# Patient Record
Sex: Male | Born: 2000 | Race: Black or African American | Hispanic: No | Marital: Single | Smoking: Never smoker
Health system: Southern US, Community
[De-identification: ages and names within clinical notes are randomized; demographics above are authoritative.]

---
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
## 2018-02-06 DIAGNOSIS — R55 Syncope and collapse: Secondary | ICD-10-CM | POA: Diagnosis not present

## 2018-02-06 DIAGNOSIS — R531 Weakness: Secondary | ICD-10-CM | POA: Diagnosis not present

## 2018-04-01 ENCOUNTER — Emergency Department (HOSPITAL_COMMUNITY)
Admission: EM | Admit: 2018-04-01 | Discharge: 2018-04-01 | Disposition: A | Discharge status: Home / Self Care | Payer: Medicaid Other | Attending: Emergency Medicine | Admitting: Emergency Medicine

## 2018-04-01 ENCOUNTER — Encounter (HOSPITAL_COMMUNITY): Payer: Self-pay | Admitting: Emergency Medicine

## 2018-04-01 DIAGNOSIS — J45909 Unspecified asthma, uncomplicated: Secondary | ICD-10-CM | POA: Diagnosis not present

## 2018-04-01 DIAGNOSIS — R0981 Nasal congestion: Secondary | ICD-10-CM | POA: Insufficient documentation

## 2018-04-01 MED ORDER — ALBUTEROL SULFATE HFA 108 (90 BASE) MCG/ACT IN AERS: 1.0000 | INHALATION_SPRAY | Freq: Four times a day (QID) | RESPIRATORY_TRACT | 0 refills | Status: AC | PRN

## 2018-04-01 NOTE — ED Notes (Signed)
Pt is complaining of sinus pressure as well as ear aches. States started a few days ago and has been "coming and going" Has tried [employer] and [employer]

## 2018-04-01 NOTE — ED Provider Notes (Signed)
[facility] EMERGENCY DEPARTMENT Provider Note   CSN: [id number] Arrival date & time: 04/01/18  1755     History   Chief Complaint Chief Complaint  Patient presents with  . Nasal Congestion    HPI Brandon Hull is a 17 y.o. male.   URI   This is a new problem. The current episode started 12 to 24 hours ago. The problem has not changed since onset.There has been no fever. Associated symptoms include congestion, headaches (mild frontal sinus), plugged ear sensation, rhinorrhea, sinus pain and wheezing (yesterday, improved after inhaler). Pertinent negatives include no chest pain, no nausea, no vomiting, no ear pain, no sneezing, no sore throat, no swollen glands and no cough.    Past Medical History:  Diagnosis Date  . Asthma     Patient Active Problem List   Diagnosis Date Noted  . Fracture, posterior malleolus 12/18/2013    History reviewed. No pertinent surgical history.      Home Medications    Prior to Admission medications   Medication Sig Start Date End Date Taking? Authorizing Provider  Acetaminophen (TYLENOL PO) Take 1 tablet by mouth once.    [provider]  albuterol (PROVENTIL HFA;VENTOLIN HFA) 108 (90 Base) MCG/ACT inhaler Inhale 1-2 puffs into the lungs every 6 (six) hours as needed for wheezing. 04/01/18   Acacia Latorre, Barbara Cower, MD    Family History Family History  Problem Relation Age of Onset  . Diabetes Other   . Hypertension Other     Social History Social History   Tobacco Use  . Smoking status: Never Smoker  . Smokeless tobacco: Never Used  Substance Use Topics  . Alcohol use: No  . Drug use: Yes    Types: Marijuana    Comment: daily     Allergies   Peanuts [peanut oil]   Review of Systems Review of Systems  HENT: Positive for congestion, rhinorrhea and sinus pain. Negative for ear pain, sneezing and sore throat.   Respiratory: Positive for wheezing (yesterday, improved after inhaler). Negative for cough.     Cardiovascular: Negative for chest pain.  Gastrointestinal: Negative for nausea and vomiting.  Neurological: Positive for headaches (mild frontal sinus).  All other systems reviewed and are negative.    Physical Exam Updated Vital Signs BP 127/67 (BP Location: Right Arm)   Pulse 85   Temp 98.8 F (37.1 C) (Oral)   Resp 16   Ht 6\' 3"  (1.905 m)   Wt 59 kg   SpO2 99%   BMI 16.25 kg/m   Physical Exam  Constitutional: He appears well-developed and well-nourished.  HENT:  Head: Normocephalic and atraumatic.  Nose: Mucosal edema and rhinorrhea present.  Mouth/Throat: Oropharynx is clear and moist.  Eyes: Conjunctivae and EOM are normal.  Neck: Normal range of motion.  Cardiovascular: Normal rate.  Pulmonary/Chest: Effort normal. No respiratory distress.  Abdominal: He exhibits no distension.  Musculoskeletal: Normal range of motion.  Neurological: He is alert.  Nursing note and vitals reviewed.    ED Treatments / Results  Labs (all labs ordered are listed, but only abnormal results are displayed) Labs Reviewed - No data to display  EKG None  Radiology No results found.  Procedures Procedures (including critical care time)  Medications Ordered in ED Medications - No data to display   Initial Impression / Assessment and Plan / ED Course  I have reviewed the triage vital signs and the nursing notes.  Pertinent labs & imaging results that were available during my  care of the patient were reviewed by me and considered in my medical decision making (see chart for details).     URI without e/o complication or other associated symptoms. Patient states inhaler ran out yesterday. No e/o acute asthma exacerbation, will rx same, no indication for steroids currently.   Final Clinical Impressions(s) / ED Diagnoses   Final diagnoses:  Nasal congestion    ED Discharge Orders         Ordered    albuterol (PROVENTIL HFA;VENTOLIN HFA) 108 (90 Base) MCG/ACT inhaler   Every 6 hours PRN     04/01/18 1905           Shayan Bramhall, Barbara Cower, MD 04/01/18 2119

## 2018-04-01 NOTE — ED Triage Notes (Signed)
Pt reports nasal congestion for a week with pressure in head.

## 2018-04-01 NOTE — Discharge Instructions (Addendum)
You can try flonase or neti pots (or any type of nasal saline rinse) for your symptoms.  You can also add on a form of sudafed if you like this may give more help but don't take at night.  Follow up with your doctor if not improving in 7-10 days.

## 2019-05-13 ENCOUNTER — Emergency Department (HOSPITAL_COMMUNITY)
Admission: EM | Admit: 2019-05-13 | Discharge: 2019-05-13 | Disposition: A | Discharge status: Home / Self Care | Payer: Medicaid Other | Attending: Emergency Medicine | Admitting: Emergency Medicine

## 2019-05-13 ENCOUNTER — Other Ambulatory Visit: Payer: Self-pay

## 2019-05-13 ENCOUNTER — Encounter (HOSPITAL_COMMUNITY): Payer: Self-pay | Admitting: Emergency Medicine

## 2019-05-13 DIAGNOSIS — H9201 Otalgia, right ear: Secondary | ICD-10-CM | POA: Diagnosis present

## 2019-05-13 DIAGNOSIS — Z79899 Other long term (current) drug therapy: Secondary | ICD-10-CM | POA: Insufficient documentation

## 2019-05-13 DIAGNOSIS — Z91010 Allergy to peanuts: Secondary | ICD-10-CM | POA: Diagnosis not present

## 2019-05-13 DIAGNOSIS — H6123 Impacted cerumen, bilateral: Secondary | ICD-10-CM | POA: Diagnosis not present

## 2019-05-13 DIAGNOSIS — J45909 Unspecified asthma, uncomplicated: Secondary | ICD-10-CM | POA: Insufficient documentation

## 2019-05-13 MED ORDER — CARBAMIDE PEROXIDE 6.5 % OT SOLN: 5.0000 [drp] | Freq: Two times a day (BID) | OTIC | 0 refills | Status: AC

## 2019-05-13 NOTE — ED Provider Notes (Signed)
[facility] EMERGENCY DEPARTMENT Provider Note   CSN: [id number] Arrival date & time: 05/13/19  1726     History   Chief Complaint Chief Complaint  Patient presents with  . Otalgia    HPI Brandon Hull is a 18 y.o. male.     HPI   18 year old male with history of asthma presenting for evaluation of right ear pain.  Patient states that last week he developed a bump to his right auricle it was painful.  States that he popped this and a small amount of pus came out.  Pain is improved since onset.  States he did not have any pain from this for the last 2 days however today while he was playing videogames he had a sharp pain in the right ear that lasted for about a minute.  Because of this he decided to come to the ED. Denies fevers or other associated sxs.   Past Medical History:  Diagnosis Date  . Asthma     Patient Active Problem List   Diagnosis Date Noted  . Fracture, posterior malleolus 12/18/2013    History reviewed. No pertinent surgical history.      Home Medications    Prior to Admission medications   Medication Sig Start Date End Date Taking? Authorizing Provider  Acetaminophen (TYLENOL PO) Take 1 tablet by mouth once.    [provider]  albuterol (PROVENTIL HFA;VENTOLIN HFA) 108 (90 Base) MCG/ACT inhaler Inhale 1-2 puffs into the lungs every 6 (six) hours as needed for wheezing. 04/01/18   Mesner, Corene Cornea, MD  carbamide peroxide (DEBROX) 6.5 % OTIC solution Place 5 drops into both ears 2 (two) times daily for 4 days. 05/13/19 05/17/19  Jackqueline Aquilar S, PA-C    Family History Family History  Problem Relation Age of Onset  . Diabetes Other   . Hypertension Other     Social History Social History   Tobacco Use  . Smoking status: Never Smoker  . Smokeless tobacco: Never Used  Substance Use Topics  . Alcohol use: No  . Drug use: Yes    Types: Marijuana    Comment: daily     Allergies   Peanuts [peanut oil]   Review of Systems  Review of Systems  Constitutional: Negative for fever.  HENT: Positive for ear pain (resolved). Negative for congestion, rhinorrhea and sore throat.   Respiratory: Negative for cough.      Physical Exam Updated Vital Signs BP (!) 141/85 (BP Location: Right Arm)   Pulse (!) 102   Temp 99 F (37.2 C) (Oral)   Resp 18   Ht 6\' 3"  (1.905 m)   Wt 59.9 kg   SpO2 99%   BMI 16.50 kg/m   Physical Exam Constitutional:      General: He is not in acute distress.    Appearance: He is well-developed.  HENT:     Right Ear: External ear normal. There is impacted cerumen.     Left Ear: External ear normal. There is impacted cerumen.  Eyes:     Conjunctiva/sclera: Conjunctivae normal.  Cardiovascular:     Rate and Rhythm: Normal rate and regular rhythm.  Pulmonary:     Effort: Pulmonary effort is normal.     Breath sounds: Normal breath sounds.  Skin:    General: Skin is warm and dry.  Neurological:     Mental Status: He is alert and oriented to person, place, and time.      ED Treatments / Results  Labs (  all labs ordered are listed, but only abnormal results are displayed) Labs Reviewed - No data to display  EKG None  Radiology No results found.  Procedures Procedures (including critical care time)  Medications Ordered in ED Medications - No data to display   Initial Impression / Assessment and Plan / ED Course  I have reviewed the triage vital signs and the nursing notes.  Pertinent labs & imaging results that were available during my care of the patient were reviewed by me and considered in my medical decision making (see chart for details).     Final Clinical Impressions(s) / ED Diagnoses   Final diagnoses:  Bilateral impacted cerumen   18 year old male presents for evaluation of right ear pain that has since resolved.  A week ago he had a pain to his right ear and had what he described as a pimple which he popped.  Symptoms then resolved however he developed  a sharp temporary pain today which brought him to the emergency room.  On exam, external auditory canals are normal bilaterally but he does have impacted cerumen bilaterally.  He does not have any redness or skin changes to suggest infection.  I am unable to visualize his TMs due to cerumen impaction. I have low suspicion for OM or OE. I will tx him with debrox for cerumen impaction. Advised to f/u and return if worse.   ED Discharge Orders         Ordered    carbamide peroxide (DEBROX) 6.5 % OTIC solution  2 times daily     05/13/19 1838           Karrie Meres, PA-C 05/13/19 1839    Mancel Bale, MD 05/13/19 2321

## 2019-05-13 NOTE — ED Triage Notes (Signed)
PT c/o right ear pain x2 days. PT states he had a bump inside the right ear and it opened up and drained.

## 2019-05-13 NOTE — Discharge Instructions (Addendum)
Use ear drops as directed.   Please follow up with your primary care provider within 5-7 days for re-evaluation of your symptoms. If you do not have a primary care provider, information for a healthcare clinic has been provided for you to make arrangements for follow up care. Please return to the emergency department for any new or worsening symptoms.

## 2019-05-15 ENCOUNTER — Other Ambulatory Visit: Payer: Self-pay

## 2019-05-15 DIAGNOSIS — Z20828 Contact with and (suspected) exposure to other viral communicable diseases: Secondary | ICD-10-CM | POA: Diagnosis not present

## 2019-05-15 DIAGNOSIS — Z20822 Contact with and (suspected) exposure to covid-19: Secondary | ICD-10-CM

## 2019-05-17 LAB — NOVEL CORONAVIRUS, NAA: SARS-CoV-2, NAA: NOT DETECTED

## 2019-05-23 ENCOUNTER — Emergency Department (HOSPITAL_COMMUNITY)
Admission: EM | Admit: 2019-05-23 | Discharge: 2019-05-23 | Disposition: A | Discharge status: Home / Self Care | Payer: Medicaid Other | Attending: Emergency Medicine | Admitting: Emergency Medicine

## 2019-05-23 ENCOUNTER — Other Ambulatory Visit: Payer: Self-pay

## 2019-05-23 ENCOUNTER — Encounter (HOSPITAL_COMMUNITY): Payer: Self-pay | Admitting: *Deleted

## 2019-05-23 DIAGNOSIS — J45909 Unspecified asthma, uncomplicated: Secondary | ICD-10-CM | POA: Diagnosis not present

## 2019-05-23 DIAGNOSIS — K0889 Other specified disorders of teeth and supporting structures: Secondary | ICD-10-CM | POA: Diagnosis not present

## 2019-05-23 DIAGNOSIS — K029 Dental caries, unspecified: Secondary | ICD-10-CM | POA: Diagnosis not present

## 2019-05-23 DIAGNOSIS — F1210 Cannabis abuse, uncomplicated: Secondary | ICD-10-CM | POA: Diagnosis not present

## 2019-05-23 MED ORDER — PENICILLIN V POTASSIUM 500 MG PO TABS: 500.0000 mg | ORAL_TABLET | Freq: Four times a day (QID) | ORAL | 0 refills | Status: AC

## 2019-05-23 NOTE — ED Provider Notes (Signed)
[facility] EMERGENCY DEPARTMENT Provider Note   CSN: [id number] Arrival date & time: 05/23/19  1530     History Chief Complaint  Patient presents with  . Dental Pain    Brandon Hull is a 18 y.o. male.  HPI   Patient is a 18 year old male with a history of asthma who presents the emergency department today complaining of left upper dental pain that started while he was at work earlier today.  States he took some over-the-counter medication which ultimately did improve his symptoms and now his pain is improved but he did have to leave work and he needs a work note now.  He has had no fevers.  He does have a dentist but he has not tried following up with a dentist.  Denies any facial swelling or other symptoms.  Past Medical History:  Diagnosis Date  . Asthma     Patient Active Problem List   Diagnosis Date Noted  . Fracture, posterior malleolus 12/18/2013    History reviewed. No pertinent surgical history.     Family History  Problem Relation Age of Onset  . Diabetes Other   . Hypertension Other     Social History   Tobacco Use  . Smoking status: Never Smoker  . Smokeless tobacco: Never Used  Substance Use Topics  . Alcohol use: No  . Drug use: Yes    Types: Marijuana    Comment: daily    Home Medications Prior to Admission medications   Medication Sig Start Date End Date Taking? Authorizing Provider  Acetaminophen (TYLENOL PO) Take 1 tablet by mouth once.    [provider]  albuterol (PROVENTIL HFA;VENTOLIN HFA) 108 (90 Base) MCG/ACT inhaler Inhale 1-2 puffs into the lungs every 6 (six) hours as needed for wheezing. 04/01/18   Mesner, Corene Cornea, MD  penicillin v potassium (VEETID) 500 MG tablet Take 1 tablet (500 mg total) by mouth 4 (four) times daily for 7 days. 05/23/19 05/30/19  Genessa Beman S, PA-C    Allergies    Peanuts [peanut oil]  Review of Systems   Review of Systems  Constitutional: Negative for fever.  HENT: Positive for  dental problem. Negative for sore throat.     Physical Exam Updated Vital Signs BP (!) 130/91 (BP Location: Right Arm)   Pulse 98   Temp 98.2 F (36.8 C) (Oral)   Resp 18   Ht 6\' 3"  (1.905 m)   Wt 61.2 kg   SpO2 100%   BMI 16.87 kg/m   Physical Exam Constitutional:      General: He is not in acute distress.    Appearance: He is well-developed.  HENT:     Mouth/Throat:     Comments: Tooth number 14 has dental caries and is TTP. No periapical abscess present. No trismus, sublingual or submandibular swelling.  Eyes:     Conjunctiva/sclera: Conjunctivae normal.  Cardiovascular:     Rate and Rhythm: Normal rate and regular rhythm.  Pulmonary:     Effort: Pulmonary effort is normal.     Breath sounds: Normal breath sounds.  Lymphadenopathy:     Cervical: No cervical adenopathy.  Skin:    General: Skin is warm and dry.  Neurological:     Mental Status: He is alert and oriented to person, place, and time.     ED Results / Procedures / Treatments   Labs (all labs ordered are listed, but only abnormal results are displayed) Labs Reviewed - No data to display  EKG  None  Radiology No results found.  Procedures Procedures (including critical care time)  Medications Ordered in ED Medications - No data to display  ED Course  I have reviewed the triage vital signs and the nursing notes.  Pertinent labs & imaging results that were available during my care of the patient were reviewed by me and considered in my medical decision making (see chart for details).    MDM Rules/Calculators/A&P       Patient with toothache.  No gross abscess.  Exam unconcerning for Ludwig's angina or spread of infection.  Will treat with penicillin and pain medicine.  Urged patient to follow-up with dentist.     Final Clinical Impression(s) / ED Diagnoses Final diagnoses:  Pain, dental  Dental caries    Rx / DC Orders ED Discharge Orders         Ordered    penicillin v potassium  (VEETID) 500 MG tablet  4 times daily     05/23/19 [address] [place], PA-C 05/23/19 1606    Gerhard Munch, MD 05/26/19 [phone]

## 2019-05-23 NOTE — Discharge Instructions (Addendum)
You were given a prescription for antibiotics. Please take the antibiotic prescription fully.   Please follow-up with a dentist in the next 5 to 7 days for reevaluation.  If you do not have a dentist, resources were provided for dentist in the area in your discharge summary.  Please contact one of the offices that are listed and make an appointment for follow-up.  Please return to the emergency department for any new or worsening symptoms.  

## 2019-05-23 NOTE — ED Triage Notes (Signed)
Pt c/o left upper tooth pain; pt states he has a tooth with a hole in it and he woke up in pain this am with the pain getting progressively worse throughout the day

## 2020-01-22 ENCOUNTER — Other Ambulatory Visit: Payer: Self-pay

## 2020-01-22 ENCOUNTER — Other Ambulatory Visit: Payer: Medicaid Other

## 2020-01-22 DIAGNOSIS — Z20822 Contact with and (suspected) exposure to covid-19: Secondary | ICD-10-CM | POA: Diagnosis not present

## 2020-01-23 LAB — NOVEL CORONAVIRUS, NAA: SARS-CoV-2, NAA: NOT DETECTED

## 2020-01-23 LAB — SARS-COV-2, NAA 2 DAY TAT

## 2020-02-11 ENCOUNTER — Other Ambulatory Visit: Payer: Self-pay

## 2020-02-11 ENCOUNTER — Emergency Department (HOSPITAL_COMMUNITY)
Admission: EM | Admit: 2020-02-11 | Discharge: 2020-02-11 | Disposition: A | Discharge status: Home / Self Care | Payer: Medicaid Other | Attending: Emergency Medicine | Admitting: Emergency Medicine

## 2020-02-11 ENCOUNTER — Encounter (HOSPITAL_COMMUNITY): Payer: Self-pay

## 2020-02-11 DIAGNOSIS — M545 Low back pain: Secondary | ICD-10-CM | POA: Diagnosis not present

## 2020-02-11 DIAGNOSIS — Z5321 Procedure and treatment not carried out due to patient leaving prior to being seen by health care provider: Secondary | ICD-10-CM | POA: Diagnosis not present

## 2020-02-11 NOTE — ED Triage Notes (Signed)
Pt presents to ED with complaints of lower back pain x a couple weeks. Pt denies urinary symptoms.

## 2020-02-11 NOTE — ED Notes (Signed)
Left prior to triage 

## 2020-02-22 DIAGNOSIS — Z20822 Contact with and (suspected) exposure to covid-19: Secondary | ICD-10-CM | POA: Diagnosis not present

## 2021-10-07 ENCOUNTER — Other Ambulatory Visit: Payer: Self-pay

## 2021-10-07 ENCOUNTER — Encounter (HOSPITAL_COMMUNITY): Payer: Self-pay

## 2021-10-07 ENCOUNTER — Emergency Department (HOSPITAL_COMMUNITY)
Admission: EM | Admit: 2021-10-07 | Discharge: 2021-10-08 | Disposition: A | Discharge status: Home / Self Care | Payer: Medicaid Other | Attending: Emergency Medicine | Admitting: Emergency Medicine

## 2021-10-07 DIAGNOSIS — M542 Cervicalgia: Secondary | ICD-10-CM | POA: Insufficient documentation

## 2021-10-07 DIAGNOSIS — Z91010 Allergy to peanuts: Secondary | ICD-10-CM | POA: Insufficient documentation

## 2021-10-07 DIAGNOSIS — Y92410 Unspecified street and highway as the place of occurrence of the external cause: Secondary | ICD-10-CM | POA: Insufficient documentation

## 2021-10-07 DIAGNOSIS — S39012A Strain of muscle, fascia and tendon of lower back, initial encounter: Secondary | ICD-10-CM | POA: Insufficient documentation

## 2021-10-07 DIAGNOSIS — S3992XA Unspecified injury of lower back, initial encounter: Secondary | ICD-10-CM | POA: Diagnosis present

## 2021-10-07 MED ORDER — IBUPROFEN 800 MG PO TABS: 800.0000 mg | ORAL_TABLET | Freq: Four times a day (QID) | ORAL | 0 refills | Status: AC | PRN

## 2021-10-07 MED ORDER — IBUPROFEN 800 MG PO TABS
800.0000 mg | ORAL_TABLET | Freq: Once | ORAL | Status: AC
  Administered 2021-10-08: 800 mg via ORAL
  Filled 2021-10-07: qty 1

## 2021-10-07 MED ORDER — OXYCODONE-ACETAMINOPHEN 5-325 MG PO TABS
1.0000 | ORAL_TABLET | Freq: Once | ORAL | Status: AC
  Administered 2021-10-08: 1 via ORAL
  Filled 2021-10-07: qty 1

## 2021-10-07 MED ORDER — METHOCARBAMOL 500 MG PO TABS: 500.0000 mg | ORAL_TABLET | Freq: Three times a day (TID) | ORAL | 0 refills | Status: AC | PRN
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
## 2021-10-10 ENCOUNTER — Other Ambulatory Visit: Payer: Self-pay

## 2021-10-10 ENCOUNTER — Encounter (HOSPITAL_COMMUNITY): Payer: Self-pay | Admitting: *Deleted

## 2021-10-10 ENCOUNTER — Emergency Department (HOSPITAL_COMMUNITY)
Admission: EM | Admit: 2021-10-10 | Discharge: 2021-10-10 | Disposition: A | Discharge status: Home / Self Care | Payer: Medicaid Other | Attending: Emergency Medicine | Admitting: Emergency Medicine

## 2021-10-10 ENCOUNTER — Emergency Department (HOSPITAL_COMMUNITY): Payer: Medicaid Other

## 2021-10-10 DIAGNOSIS — Z91010 Allergy to peanuts: Secondary | ICD-10-CM | POA: Insufficient documentation

## 2021-10-10 DIAGNOSIS — Y92410 Unspecified street and highway as the place of occurrence of the external cause: Secondary | ICD-10-CM | POA: Insufficient documentation

## 2021-10-10 DIAGNOSIS — S39012D Strain of muscle, fascia and tendon of lower back, subsequent encounter: Secondary | ICD-10-CM | POA: Insufficient documentation

## 2021-10-10 DIAGNOSIS — S3992XD Unspecified injury of lower back, subsequent encounter: Secondary | ICD-10-CM | POA: Diagnosis present

## 2021-10-10 DIAGNOSIS — M546 Pain in thoracic spine: Secondary | ICD-10-CM | POA: Insufficient documentation

## 2021-10-10 DIAGNOSIS — M545 Low back pain, unspecified: Secondary | ICD-10-CM | POA: Diagnosis not present

## 2021-10-10 DIAGNOSIS — S39012A Strain of muscle, fascia and tendon of lower back, initial encounter: Secondary | ICD-10-CM | POA: Diagnosis not present

## 2021-10-10 MED ORDER — HYDROCODONE-ACETAMINOPHEN 5-325 MG PO TABS
1.0000 | ORAL_TABLET | Freq: Once | ORAL | Status: AC
  Administered 2021-10-10: 1 via ORAL
  Filled 2021-10-10: qty 1

## 2021-10-10 MED ORDER — NAPROXEN 375 MG PO TABS: 375.0000 mg | ORAL_TABLET | Freq: Two times a day (BID) | ORAL | 0 refills | Status: AC

## 2021-10-10 MED ORDER — METHOCARBAMOL 500 MG PO TABS: 500.0000 mg | ORAL_TABLET | Freq: Every evening | ORAL | 0 refills | Status: AC

## 2021-10-10 NOTE — Discharge Instructions (Addendum)
Your x-ray images were negative for acute fracture in the lower back.  You have been provided the contact information for an orthopedic office to see Dr. Romeo Apple.  Please call and schedule an appointment within the next 2 to 3 days. ? ?A prescription for Robaxin has been continued.  Remember to only take 1 tablet at night before you go to bed.  He may also continue the use of anti-inflammatory such as ibuprofen or naproxen in conjunction with Tylenol. ? ?Return to the ED for new or worsening symptoms as discussed. ?
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
## 2021-10-10 NOTE — ED Notes (Signed)
ED Provider at bedside. 
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
## 2021-10-11 ENCOUNTER — Telehealth: Payer: Self-pay

## 2021-10-11 NOTE — Telephone Encounter (Signed)
Transition Care Management Unsuccessful Follow-up Telephone Call ? ?Date of discharge and from where:  10/10/2021 from [facility] ? ?Attempts:  1st Attempt ? ?Reason for unsuccessful TCM follow-up call:  Unable to leave message ? ? ? ?

## 2021-10-12 NOTE — Telephone Encounter (Signed)
Transition Care Management Unsuccessful Follow-up Telephone Call ? ?Date of discharge and from where:  10/10/2021 from [facility] ? ?Attempts:  2nd Attempt ? ?Reason for unsuccessful TCM follow-up call:  Unable to leave message ? ? ? ?

## 2021-10-13 NOTE — Telephone Encounter (Signed)
Transition Care Management Unsuccessful Follow-up Telephone Call ? ?Date of discharge and from where:  10/10/2021 from [facility] ? ?Attempts:  3rd Attempt ? ?Reason for unsuccessful TCM follow-up call:  Unable to reach patient ? ? ? ?

## 2021-11-09 ENCOUNTER — Emergency Department (HOSPITAL_COMMUNITY)
Admission: EM | Admit: 2021-11-09 | Discharge: 2021-11-09 | Disposition: A | Discharge status: Home / Self Care | Payer: Medicaid Other | Attending: Emergency Medicine | Admitting: Emergency Medicine

## 2021-11-09 ENCOUNTER — Other Ambulatory Visit: Payer: Self-pay

## 2021-11-09 ENCOUNTER — Emergency Department (HOSPITAL_COMMUNITY): Payer: Medicaid Other

## 2021-11-09 ENCOUNTER — Encounter (HOSPITAL_COMMUNITY): Payer: Self-pay | Admitting: Emergency Medicine

## 2021-11-09 DIAGNOSIS — R000 Tachycardia, unspecified: Secondary | ICD-10-CM

## 2021-11-09 DIAGNOSIS — R739 Hyperglycemia, unspecified: Secondary | ICD-10-CM

## 2021-11-09 DIAGNOSIS — Z91010 Allergy to peanuts: Secondary | ICD-10-CM | POA: Diagnosis not present

## 2021-11-09 DIAGNOSIS — F419 Anxiety disorder, unspecified: Secondary | ICD-10-CM | POA: Diagnosis not present

## 2021-11-09 DIAGNOSIS — R002 Palpitations: Secondary | ICD-10-CM | POA: Diagnosis not present

## 2021-11-09 DIAGNOSIS — R079 Chest pain, unspecified: Secondary | ICD-10-CM | POA: Diagnosis not present

## 2021-11-09 LAB — URINALYSIS, ROUTINE W REFLEX MICROSCOPIC
Bilirubin Urine: NEGATIVE
Glucose, UA: NEGATIVE mg/dL
Hgb urine dipstick: NEGATIVE
Ketones, ur: NEGATIVE mg/dL
Leukocytes,Ua: NEGATIVE
Nitrite: NEGATIVE
Protein, ur: NEGATIVE mg/dL
Specific Gravity, Urine: 1.005 (ref 1.005–1.030)
pH: 7 (ref 5.0–8.0)

## 2021-11-09 LAB — BASIC METABOLIC PANEL
Anion gap: 9 (ref 5–15)
BUN: 14 mg/dL (ref 6–20)
CO2: 20 mmol/L — ABNORMAL LOW (ref 22–32)
Calcium: 9.5 mg/dL (ref 8.9–10.3)
Chloride: 107 mmol/L (ref 98–111)
Creatinine, Ser: 1.23 mg/dL (ref 0.61–1.24)
GFR, Estimated: >60 mL/min (ref >60)
Glucose, Bld: 166 mg/dL — ABNORMAL HIGH (ref 70–99)
Potassium: 3 mmol/L — ABNORMAL LOW (ref 3.5–5.1)
Sodium: 136 mmol/L (ref 135–145)

## 2021-11-09 LAB — RAPID URINE DRUG SCREEN, HOSP PERFORMED
Amphetamines: NOT DETECTED
Barbiturates: NOT DETECTED
Benzodiazepines: NOT DETECTED
Cocaine: NOT DETECTED
Opiates: NOT DETECTED
Tetrahydrocannabinol: POSITIVE — AB

## 2021-11-09 LAB — CBC
HCT: 41.8 % (ref 39.0–52.0)
Hemoglobin: 14.9 g/dL (ref 13.0–17.0)
MCH: 31.3 pg (ref 26.0–34.0)
MCHC: 35.6 g/dL (ref 30.0–36.0)
MCV: 87.8 fL (ref 80.0–100.0)
Platelets: 258 10*3/uL (ref 150–400)
RBC: 4.76 MIL/uL (ref 4.22–5.81)
RDW: 12.3 % (ref 11.5–15.5)
WBC: 5.6 10*3/uL (ref 4.0–10.5)
nRBC: 0 % (ref 0.0–0.2)

## 2021-11-09 LAB — TSH: TSH: 0.769 u[IU]/mL (ref 0.350–4.500)

## 2021-11-09 MED ORDER — LORAZEPAM 2 MG/ML IJ SOLN
1.0000 mg | Freq: Once | INTRAMUSCULAR | Status: AC
  Administered 2021-11-09: 1 mg via INTRAVENOUS
  Filled 2021-11-09: qty 1

## 2021-11-09 MED ORDER — SODIUM CHLORIDE 0.9 % IV BOLUS
500.0000 mL | Freq: Once | INTRAVENOUS | Status: AC
  Administered 2021-11-09: 500 mL via INTRAVENOUS

## 2021-11-09 MED ORDER — POTASSIUM CHLORIDE CRYS ER 20 MEQ PO TBCR
40.0000 meq | EXTENDED_RELEASE_TABLET | Freq: Once | ORAL | Status: AC
  Administered 2021-11-09: 40 meq via ORAL
  Filled 2021-11-09: qty 2

## 2021-11-09 MED ORDER — HYDROXYZINE HCL 25 MG PO TABS: 25.0000 mg | ORAL_TABLET | Freq: Four times a day (QID) | ORAL | 0 refills | Status: AC | PRN

## 2021-11-09 NOTE — ED Provider Notes (Signed)
[facility] EMERGENCY DEPARTMENT Provider Note   CSN: [id number] Arrival date & time: 11/09/21  1836     History {Add pertinent medical, surgical, social history, OB history to HPI:1} Chief Complaint  Patient presents with   Palpitations    Brandon Hull is a 21 y.o. male who presents emergency department chief complaint of racing heart, palpitations and anxiety.  Patient states that all of this began about 2 weeks ago when he lost his job.  He states that he has been ruminating about his inability to do the right thing for his family and he has been feeling extremely anxious.  Patient states that he does use marijuana regularly and today his symptoms became a lot worse after he smoked marijuana.  He does not use other drugs or alcohol.  He does not have a known history of severe anxiety.  He admits to feeling his heart racing all the time.  He feels anxious.  He has had 1 episode of night sweats.  He denies severe shortness of breath.  He feels more fatigued.   Palpitations     Home Medications Prior to Admission medications   Medication Sig Start Date End Date Taking? Authorizing Provider  Acetaminophen (TYLENOL PO) Take 1 tablet by mouth once.    [provider]  albuterol (PROVENTIL HFA;VENTOLIN HFA) 108 (90 Base) MCG/ACT inhaler Inhale 1-2 puffs into the lungs every 6 (six) hours as needed for wheezing. 04/01/18   Mesner, Barbara Cower, MD  ibuprofen (ADVIL) 800 MG tablet Take 1 tablet (800 mg total) by mouth every 6 (six) hours as needed for moderate pain. 10/07/21   Gilda Crease, MD  methocarbamol (ROBAXIN) 500 MG tablet Take 1 tablet (500 mg total) by mouth every 8 (eight) hours as needed for muscle spasms. 10/07/21   Gilda Crease, MD  naproxen (NAPROSYN) 375 MG tablet Take 1 tablet (375 mg total) by mouth 2 (two) times daily. 10/10/21   Cecil Cobbs, PA-C      Allergies    Peanuts [peanut oil]    Review of Systems   Review of Systems   Cardiovascular:  Positive for palpitations.   Physical Exam Updated Vital Signs BP (!) 160/114   Pulse (!) 145   Temp 97.7 F (36.5 C) (Oral)   Resp 20   Ht 6\' 3"  (1.905 m)   Wt 72.6 kg   SpO2 100%   BMI 20.00 kg/m  Physical Exam Vitals and nursing note reviewed.  Constitutional:      General: He is not in acute distress.    Appearance: He is well-developed. He is not diaphoretic.  HENT:     Head: Normocephalic and atraumatic.  Eyes:     General: No scleral icterus.    Conjunctiva/sclera: Conjunctivae normal.  Cardiovascular:     Rate and Rhythm: Regular rhythm. Tachycardia present.     Heart sounds: Normal heart sounds.  Pulmonary:     Effort: Pulmonary effort is normal. No respiratory distress.     Breath sounds: Normal breath sounds.  Abdominal:     Palpations: Abdomen is soft.     Tenderness: There is no abdominal tenderness.  Musculoskeletal:     Cervical back: Normal range of motion and neck supple.  Skin:    General: Skin is warm and dry.  Neurological:     Mental Status: He is alert.  Psychiatric:        Mood and Affect: Mood is anxious.  Behavior: Behavior normal.    ED Results / Procedures / Treatments   Labs (all labs ordered are listed, but only abnormal results are displayed) Labs Reviewed  BASIC METABOLIC PANEL  CBC    EKG None  Radiology No results found.  Procedures Procedures  {Document cardiac monitor, telemetry assessment procedure when appropriate:1}  Medications Ordered in ED Medications - No data to display  ED Course/ Medical Decision Making/ A&P                           Medical Decision Making Amount and/or Complexity of Data Reviewed Labs: ordered. Radiology: ordered.  Risk Prescription drug management.   ***  {Document critical care time when appropriate:1} {Document review of labs and clinical decision tools ie heart score, Chads2Vasc2 etc:1}  {Document your independent review of radiology images, and  any outside records:1} {Document your discussion with family members, caretakers, and with consultants:1} {Document social determinants of health affecting pt's care:1} {Document your decision making why or why not admission, treatments were needed:1} Final Clinical Impression(s) / ED Diagnoses Final diagnoses:  None    Rx / DC Orders ED Discharge Orders     None

## 2021-11-09 NOTE — Discharge Instructions (Signed)
Contact a health care provider if: You have a hard time staying focused or finishing daily tasks. You spend many hours a day feeling worried about everyday life. You become exhausted by worry. You start to have headaches or frequently feel tense. You develop chronic nausea or diarrhea. Get help right away if: You have a racing heart and shortness of breath. You have thoughts of hurting yourself or others.

## 2021-11-09 NOTE — ED Triage Notes (Signed)
Pt having heart palpitations and anxiety since losing his job on 10/31/21.

## 2021-11-09 NOTE — ED Notes (Signed)
Patient verbalizes understanding of discharge instructions. Opportunity for questioning and answers were provided. Armband removed by staff, pt discharged from ED. Ambulated out to lobby with family ? ?

## 2021-11-10 ENCOUNTER — Telehealth: Payer: Self-pay

## 2021-11-10 NOTE — Telephone Encounter (Signed)
Transition Care Management Unsuccessful Follow-up Telephone Call  Date of discharge and from where:  11/09/2021-[place]   Attempts:  1st Attempt  Reason for unsuccessful TCM follow-up call:  Unable to reach patient

## 2021-11-11 NOTE — Telephone Encounter (Signed)
Transition Care Management Unsuccessful Follow-up Telephone Call  Date of discharge and from where:  11/09/2021-[facility]   Attempts:  2nd Attempt  Reason for unsuccessful TCM follow-up call:  Unable to reach patient

## 2021-11-14 NOTE — Telephone Encounter (Signed)
Transition Care Management Unsuccessful Follow-up Telephone Call  Date of discharge and from where:  11/09/2021-[facility]   Attempts:  3rd Attempt  Reason for unsuccessful TCM follow-up call:  Unable to reach patient

## 2023-08-28 ENCOUNTER — Other Ambulatory Visit (HOSPITAL_COMMUNITY): Payer: Self-pay
